# Patient Record
Sex: Female | Born: 1985 | Race: White | Hispanic: No | Marital: Single | Smoking: Current every day smoker
Health system: Southern US, Community
[De-identification: ages and names within clinical notes are randomized; demographics above are authoritative.]

## PROBLEM LIST (undated history)

## (undated) DIAGNOSIS — I10 Essential (primary) hypertension: Secondary | ICD-10-CM

## (undated) DIAGNOSIS — F4310 Post-traumatic stress disorder, unspecified: Secondary | ICD-10-CM

## (undated) DIAGNOSIS — F411 Generalized anxiety disorder: Secondary | ICD-10-CM

---
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
## 2004-08-19 ENCOUNTER — Emergency Department (HOSPITAL_COMMUNITY): Admission: EM | Admit: 2004-08-19 | Discharge: 2004-08-19 | Payer: Self-pay | Admitting: Emergency Medicine

## 2004-12-14 ENCOUNTER — Emergency Department: Payer: Self-pay | Admitting: Emergency Medicine

## 2005-04-14 ENCOUNTER — Emergency Department (HOSPITAL_COMMUNITY): Admission: EM | Admit: 2005-04-14 | Discharge: 2005-04-14 | Payer: Self-pay | Admitting: Emergency Medicine
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
## 2008-09-04 ENCOUNTER — Emergency Department (HOSPITAL_COMMUNITY): Admission: EM | Admit: 2008-09-04 | Discharge: 2008-09-04 | Payer: Self-pay | Admitting: Emergency Medicine

## 2008-09-27 ENCOUNTER — Emergency Department (HOSPITAL_COMMUNITY): Admission: EM | Admit: 2008-09-27 | Discharge: 2008-09-27 | Payer: Self-pay | Admitting: Emergency Medicine

## 2009-02-13 ENCOUNTER — Emergency Department (HOSPITAL_COMMUNITY): Admission: EM | Admit: 2009-02-13 | Discharge: 2009-02-13 | Payer: Self-pay | Admitting: Emergency Medicine

## 2009-02-23 ENCOUNTER — Emergency Department (HOSPITAL_COMMUNITY): Admission: EM | Admit: 2009-02-23 | Discharge: 2009-02-23 | Payer: Self-pay | Admitting: Emergency Medicine

## 2009-05-21 ENCOUNTER — Emergency Department (HOSPITAL_COMMUNITY): Admission: EM | Admit: 2009-05-21 | Discharge: 2009-05-21 | Payer: Self-pay | Admitting: Emergency Medicine

## 2010-01-16 ENCOUNTER — Emergency Department (HOSPITAL_COMMUNITY): Admission: EM | Admit: 2010-01-16 | Discharge: 2010-01-16 | Payer: Self-pay | Admitting: Emergency Medicine

## 2010-05-22 ENCOUNTER — Emergency Department (HOSPITAL_COMMUNITY): Admission: EM | Admit: 2010-05-22 | Discharge: 2010-05-22 | Payer: Self-pay | Admitting: Emergency Medicine

## 2010-05-25 ENCOUNTER — Emergency Department (HOSPITAL_COMMUNITY): Admission: EM | Admit: 2010-05-25 | Discharge: 2010-05-25 | Payer: Self-pay | Admitting: Emergency Medicine

## 2010-08-03 ENCOUNTER — Emergency Department (HOSPITAL_COMMUNITY): Admission: EM | Admit: 2010-08-03 | Discharge: 2010-08-03 | Payer: Self-pay | Admitting: Emergency Medicine

## 2010-09-21 ENCOUNTER — Emergency Department (HOSPITAL_COMMUNITY): Admission: EM | Admit: 2010-09-21 | Discharge: 2010-09-21 | Payer: Self-pay | Admitting: Emergency Medicine

## 2010-12-09 ENCOUNTER — Encounter
Admission: RE | Admit: 2010-12-09 | Discharge: 2010-12-09 | Payer: Self-pay | Source: Home / Self Care | Attending: Internal Medicine | Admitting: Internal Medicine

## 2010-12-15 ENCOUNTER — Emergency Department (HOSPITAL_COMMUNITY)
Admission: EM | Admit: 2010-12-15 | Discharge: 2010-12-15 | Payer: Self-pay | Source: Home / Self Care | Admitting: Emergency Medicine

## 2010-12-23 ENCOUNTER — Encounter: Admission: RE | Admit: 2010-12-23 | Payer: Self-pay | Source: Home / Self Care | Admitting: Internal Medicine

## 2010-12-25 ENCOUNTER — Encounter
Admission: RE | Admit: 2010-12-25 | Discharge: 2010-12-25 | Payer: Self-pay | Source: Home / Self Care | Attending: Internal Medicine | Admitting: Internal Medicine

## 2011-01-24 ENCOUNTER — Emergency Department (HOSPITAL_COMMUNITY)
Admission: EM | Admit: 2011-01-24 | Discharge: 2011-01-24 | Disposition: A | Discharge status: Home / Self Care | Payer: Medicaid Other | Attending: Emergency Medicine | Admitting: Emergency Medicine

## 2011-01-24 DIAGNOSIS — G8929 Other chronic pain: Secondary | ICD-10-CM | POA: Insufficient documentation

## 2011-01-24 DIAGNOSIS — IMO0001 Reserved for inherently not codable concepts without codable children: Secondary | ICD-10-CM | POA: Insufficient documentation

## 2011-02-23 ENCOUNTER — Emergency Department (HOSPITAL_BASED_OUTPATIENT_CLINIC_OR_DEPARTMENT_OTHER)
Admission: EM | Admit: 2011-02-23 | Discharge: 2011-02-23 | Disposition: A | Discharge status: Home / Self Care | Payer: Medicaid Other | Attending: Emergency Medicine | Admitting: Emergency Medicine

## 2011-02-23 DIAGNOSIS — Z8739 Personal history of other diseases of the musculoskeletal system and connective tissue: Secondary | ICD-10-CM | POA: Insufficient documentation

## 2011-02-23 DIAGNOSIS — Z79899 Other long term (current) drug therapy: Secondary | ICD-10-CM | POA: Insufficient documentation

## 2011-02-23 DIAGNOSIS — F319 Bipolar disorder, unspecified: Secondary | ICD-10-CM | POA: Insufficient documentation

## 2011-02-23 DIAGNOSIS — M79609 Pain in unspecified limb: Secondary | ICD-10-CM | POA: Insufficient documentation

## 2011-02-23 DIAGNOSIS — G8929 Other chronic pain: Secondary | ICD-10-CM | POA: Insufficient documentation

## 2011-02-23 DIAGNOSIS — R079 Chest pain, unspecified: Secondary | ICD-10-CM | POA: Insufficient documentation

## 2011-02-23 DIAGNOSIS — M549 Dorsalgia, unspecified: Secondary | ICD-10-CM | POA: Insufficient documentation

## 2011-03-13 LAB — URINALYSIS, ROUTINE W REFLEX MICROSCOPIC
Glucose, UA: NEGATIVE mg/dL
Hgb urine dipstick: NEGATIVE
Nitrite: POSITIVE — AB
Protein, ur: NEGATIVE mg/dL
Urobilinogen, UA: 0.2 mg/dL (ref 0.0–1.0)
pH: 6 (ref 5.0–8.0)

## 2011-03-13 LAB — URINE MICROSCOPIC-ADD ON

## 2011-04-18 ENCOUNTER — Ambulatory Visit
Admission: RE | Admit: 2011-04-18 | Discharge: 2011-04-18 | Disposition: A | Discharge status: Home / Self Care | Payer: Medicaid Other | Source: Ambulatory Visit | Attending: Family Medicine | Admitting: Family Medicine

## 2011-04-18 ENCOUNTER — Other Ambulatory Visit: Payer: Self-pay | Admitting: Family Medicine

## 2011-04-18 DIAGNOSIS — M069 Rheumatoid arthritis, unspecified: Secondary | ICD-10-CM

## 2011-04-18 DIAGNOSIS — M25659 Stiffness of unspecified hip, not elsewhere classified: Secondary | ICD-10-CM

## 2011-04-18 DIAGNOSIS — M25552 Pain in left hip: Secondary | ICD-10-CM

## 2011-04-18 DIAGNOSIS — M545 Low back pain: Secondary | ICD-10-CM
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
## 2011-09-01 LAB — URINE MICROSCOPIC-ADD ON

## 2011-09-01 LAB — URINALYSIS, ROUTINE W REFLEX MICROSCOPIC
Bilirubin Urine: NEGATIVE
Specific Gravity, Urine: 1.021
pH: 7.5

## 2011-09-01 LAB — POCT PREGNANCY, URINE: Preg Test, Ur: NEGATIVE

## 2015-04-02 ENCOUNTER — Encounter (HOSPITAL_COMMUNITY): Payer: Self-pay | Admitting: Emergency Medicine

## 2015-04-02 ENCOUNTER — Emergency Department (HOSPITAL_COMMUNITY)
Admission: EM | Admit: 2015-04-02 | Discharge: 2015-04-02 | Disposition: A | Discharge status: Home / Self Care | Payer: Medicaid Other | Attending: Emergency Medicine | Admitting: Emergency Medicine

## 2015-04-02 DIAGNOSIS — Z720 Tobacco use: Secondary | ICD-10-CM | POA: Insufficient documentation

## 2015-04-02 DIAGNOSIS — K0381 Cracked tooth: Secondary | ICD-10-CM | POA: Insufficient documentation

## 2015-04-02 DIAGNOSIS — K0889 Other specified disorders of teeth and supporting structures: Secondary | ICD-10-CM

## 2015-04-02 DIAGNOSIS — K029 Dental caries, unspecified: Secondary | ICD-10-CM | POA: Insufficient documentation

## 2015-04-02 DIAGNOSIS — K088 Other specified disorders of teeth and supporting structures: Secondary | ICD-10-CM | POA: Insufficient documentation

## 2015-04-02 DIAGNOSIS — R590 Localized enlarged lymph nodes: Secondary | ICD-10-CM | POA: Insufficient documentation

## 2015-04-02 MED ORDER — IBUPROFEN 800 MG PO TABS: 800.0000 mg | ORAL_TABLET | Freq: Three times a day (TID) | ORAL | Status: AC

## 2015-04-02 MED ORDER — PENICILLIN V POTASSIUM 500 MG PO TABS: 500.0000 mg | ORAL_TABLET | Freq: Four times a day (QID) | ORAL | Status: AC

## 2015-04-02 NOTE — ED Provider Notes (Signed)
CSN: [id number]     Arrival date & time 04/02/15  1728 History   First MD Initiated Contact with Patient 04/02/15 1843     Chief Complaint  Patient presents with  . Dental Pain     (Consider location/radiation/quality/duration/timing/severity/associated sxs/prior Treatment) HPI Jennifer Moses is a 29 year old female with no known metastatic medical history who presents the ER complaining of dental pain. Patient reports diffuse dental caries, and multiple dental issues in the past. Patient reports yesterday cracked a tooth while eating a cracker, has had acute dental pain since. Patient states she does not currently have dentistry follow-up, nor does she have PCP. Patient states using Advil to help with her pain. Patient denies dysphagia, throat or mouth swelling, fever, nausea, vomiting, headache.   History reviewed. No pertinent past medical history. History reviewed. No pertinent past surgical history. No family history on file. History  Substance Use Topics  . Smoking status: Current Every Day Smoker -- 0.10 packs/day    Types: Cigarettes  . Smokeless tobacco: Not on file  . Alcohol Use: No   OB History    No data available     Review of Systems  Constitutional: Negative for fever.  HENT: Positive for dental problem.   Eyes: Negative for visual disturbance.  Respiratory: Negative for shortness of breath.   Cardiovascular: Negative for chest pain.  Gastrointestinal: Negative for nausea, vomiting and abdominal pain.  Genitourinary: Negative for dysuria.  Skin: Negative for rash.  Neurological: Negative for dizziness, syncope, weakness and numbness.  Psychiatric/Behavioral: Negative.       Allergies  Bee venom  Home Medications   Prior to Admission medications   Medication Sig Start Date End Date Taking? Authorizing Provider  ibuprofen (ADVIL,MOTRIN) 800 MG tablet Take 1 tablet (800 mg total) by mouth 3 (three) times daily. 04/02/15   Ladona Mow, PA-C  penicillin v  potassium (VEETID) 500 MG tablet Take 1 tablet (500 mg total) by mouth 4 (four) times daily. 04/02/15 04/09/15  Ladona Mow, PA-C   BP 141/83 mmHg  Pulse 75  Temp(Src) 98 F (36.7 C) (Oral)  Resp 18  SpO2 99%  LMP 03/19/2015 Physical Exam  Constitutional: She is oriented to person, place, and time. She appears well-developed and well-nourished. No distress.  HENT:  Head: Normocephalic and atraumatic.  Mouth/Throat: Uvula is midline, oropharynx is clear and moist and mucous membranes are normal. No trismus in the jaw. Dental caries present. No dental abscesses or uvula swelling. No oropharyngeal exudate, posterior oropharyngeal edema, posterior oropharyngeal erythema or tonsillar abscesses.  Diffuse dental caries upper and lower. There is noted a broken upper anterior premolar. Gumline intact. Flor mouth soft. Jawline without swelling. Mild anterior cervical lymphadenopathy. No evidence of induration, cellulitis or gross abscess.  Eyes: EOM are normal. Pupils are equal, round, and reactive to light. Right eye exhibits no discharge. Left eye exhibits no discharge. No scleral icterus.  Neck: Normal range of motion.  Pulmonary/Chest: Effort normal. No respiratory distress.  Musculoskeletal: Normal range of motion.  Neurological: She is alert and oriented to person, place, and time.  Skin: Skin is warm and dry. She is not diaphoretic.  Psychiatric: She has a normal mood and affect.  Nursing note and vitals reviewed.   ED Course  Procedures (including critical care time) Labs Review Labs Reviewed - No data to display  Imaging Review No results found.   EKG Interpretation None      MDM   Final diagnoses:  Pain, dental   Patient with  toothache.  Patient afebrile, hemodynamically stable, well-appearing and in no acute distress. No gross abscess.  Exam unconcerning for Ludwig's angina or spread of infection.  Will treat with penicillin and pain medicine.  Urged patient to follow-up with  dentist.  Discussed return precautions with patient, patient verbalizes understanding and agreement of this plan.   BP 141/83 mmHg  Pulse 75  Temp(Src) 98 F (36.7 C) (Oral)  Resp 18  SpO2 99%  LMP 03/19/2015  Signed,  Ladona MowJoe Gianni Fuchs, PA-C 8:22 PM    Ladona MowJoe Tali Cleaves, PA-C 04/02/15 2022  Azalia BilisKevin Campos, MD 04/03/15 [phone]

## 2015-04-02 NOTE — ED Notes (Addendum)
Pt reports cracked left upper tooth today eating cracker; pt states trying to get into dentist for over a year. Upon assessment generalized decay noted on ALL teeth. Pt reports took motrin prior to arrival.

## 2015-04-02 NOTE — Discharge Instructions (Signed)
Dental Pain °A tooth ache may be caused by cavities (tooth decay). Cavities expose the nerve of the tooth to air and hot or cold temperatures. It may come from an infection or abscess (also called a boil or furuncle) around your tooth. It is also often caused by dental caries (tooth decay). This causes the pain you are having. °DIAGNOSIS  °Your caregiver can diagnose this problem by exam. °TREATMENT  °· If caused by an infection, it may be treated with medications which kill germs (antibiotics) and pain medications as prescribed by your caregiver. Take medications as directed. °· Only take over-the-counter or prescription medicines for pain, discomfort, or fever as directed by your caregiver. °· Whether the tooth ache today is caused by infection or dental disease, you should see your dentist as soon as possible for further care. °SEEK MEDICAL CARE IF: °The exam and treatment you received today has been provided on an emergency basis only. This is not a substitute for complete medical or dental care. If your problem worsens or new problems (symptoms) appear, and you are unable to meet with your dentist, call or return to this location. °SEEK IMMEDIATE MEDICAL CARE IF:  °· You have a fever. °· You develop redness and swelling of your face, jaw, or neck. °· You are unable to open your mouth. °· You have severe pain uncontrolled by pain medicine. °MAKE SURE YOU:  °· Understand these instructions. °· Will watch your condition. °· Will get help right away if you are not doing well or get worse. °Document Released: 11/17/2005 Document Revised: 02/09/2012 Document Reviewed: 07/05/2008 °ExitCare® Patient Information ©2015 [employer]. This information is not intended to replace advice given to you by your health care provider. Make sure you discuss any questions you have with your health care provider. ° °Emergency Department Resource Guide °1) Find a Doctor and Pay Out of Pocket °Although you won't have to find out who  is covered by your insurance plan, it is a good idea to ask around and get recommendations. You will then need to call the office and see if the doctor you have chosen will accept you as a new patient and what types of options they offer for patients who are self-pay. Some doctors offer discounts or will set up payment plans for their patients who do not have insurance, but you will need to ask so you aren't surprised when you get to your appointment. ° °2) Contact Your Local Health Department °Not all health departments have doctors that can see patients for sick visits, but many do, so it is worth a call to see if yours does. If you don't know where your local health department is, you can check in your phone book. The CDC also has a tool to help you locate your state's health department, and many state websites also have listings of all of their local health departments. ° °3) Find a Walk-in Clinic °If your illness is not likely to be very severe or complicated, you may want to try a walk in clinic. These are popping up all over the country in pharmacies, drugstores, and shopping centers. They're usually staffed by nurse practitioners or physician assistants that have been trained to treat common illnesses and complaints. They're usually fairly quick and inexpensive. However, if you have serious medical issues or chronic medical problems, these are probably not your best option. ° °No Primary Care Doctor: °- Call [facility] at  832-8000 - they can help you locate a primary   care doctor that  accepts your insurance, provides certain services, etc. °- Physician Referral Service- 1-800-533-3463 ° °Chronic Pain Problems: °Organization         Address  Phone   Notes  °[facility]  (336) 297-2271 Patients need to be referred by their primary care doctor.  ° °Medication Assistance: °Organization         Address  Phone   Notes  °[place] [facility] [address]  [address] °[place], [place] [address] (336) 641-8030 --Must be a resident of [place] °-- Must have NO insurance coverage whatsoever (no Medicaid/ Medicare, etc.) °-- The pt. MUST have a primary care doctor that directs their care regularly and follows them in the community °  °[facility]  (866) 331-1348   °[employer]  (888) 892-1162   ° °Agencies that provide inexpensive medical care: °Organization         Address  Phone   Notes  °[facility]  (336) 832-8035   °[facility]    (336) 832-7272   °[facility] [address] °[place], [place] [address] (336) 832-4777   °[facility] [address], °[place] (336) 271-4999   °[facility]    (336) 373-0678   °[facility]    (336) 272-1050   °[facility] ° [address], [place] Phone:  (336) 832-4444, Fax:  (336) 832-4440 Hours of Operation:  9 am - 6 pm, M-F.  Also accepts Medicaid/Medicare and self-pay.  °[facility] ° [address], [place] Phone: (336) 832-3150, Fax: (336) 832-3151. Hours of Operation:  8:30 am - 5:30 pm, M-F.  Also accepts Medicaid and self-pay.  °[facility] [place] [address], [place] Phone: (336) 878-6027   °[facility] [address], [place], [place] (336)723-1848, Ext. 123 Mondays & Thursdays: 7-9 AM.  First 15 patients are seen on a first come, first serve basis. °  ° °Medicaid-accepting [place] Providers: ° °Organization         Address  Phone   Notes  °[facility] [address], [place] (336) 641-2100 Also accepts self-pay patients.  °[facility] [address], [place] ° (336) 856-9996   °[facility] [address], [place] (336) 288-8857   °[facility] [address] [place] [address], [place] (336) 299-7000   °Veita Bland [address], [place]  ° (336) 373-1557 Only accepts [place] Access Medicaid patients after they have their name applied to their card.  ° °Self-Pay (no insurance) in [place]: ° °Organization         Address  Phone   Notes  °Sickle Cell Patients, [facility] [address], [place] (336) 832-1970   °[facility] [address], [place] (336) 832-4400   °[facility] ° [address], [place] (336) 992-4800   °[facility]/Dr. Osei-Bonsu ° [address] [place] [address], [place] or [address], [place] (336) 841-8500 Phone number for both [place] and [place] locations is the same.  °[facility] [address], [place] (336) 299-0000   °[facility] [address] [place] [address], [place] or [address] (336) 852-7530 °(336) 878-2260   °  [facility] [address], [place] (336) 350-1642, phone; (336) 294-5005, fax Sees patients 1st and 3rd Saturday of every month.  Must not qualify for public or private insurance (i.e. Medicaid, Medicare, [place] Health Choice, Veterans' Benefits) • Household income should be no more than 200% of the poverty level •The clinic cannot treat you if you are pregnant or think you are pregnant • Sexually transmitted diseases are not treated at the clinic.  ° ° °Dental Care: °Organization         Address  Phone  Notes  °[facility] [address], [place] (336) 641-6152 Accepts children up to age 21 who are enrolled in Medicaid or [place] Health Choice; pregnant women with a Medicaid card; and children who have applied for Medicaid or [place] Health Choice, but were declined, whose parents can pay a reduced fee at time of service.  °[facility] [place]  [address], [place] (336) 641-7733 Accepts children up to age 21 who are  enrolled in Medicaid or [place] Health Choice; pregnant women with a Medicaid card; and children who have applied for Medicaid or [place] Health Choice, but were declined, whose parents can pay a reduced fee at time of service.  °[facility] ° [address], [place] (336) 641-4533 Patients are seen by appointment only. Walk-ins are not accepted. [facility] will see patients 18 years of age and older. °Monday - Tuesday (8am-5pm) °Most Wednesdays (8:30-5pm) °$30 per visit, cash only  °[facility] ° [address], [place] (336) 641-4533 Patients are seen by appointment only. Walk-ins are not accepted. [facility] will see patients 18 years of age and older. °One Wednesday Evening (Monthly: Volunteer Based).  $30 per visit, cash only  °[facility]  (919) 537-3737 for adults; Children under age 4, call Graduate Pediatric Dentistry at ([phone]) [phone]. Children aged 4-14, please call (919) [phone] to request a pediatric application. ° Dental services are provided in all areas of dental care including fillings, crowns and bridges, complete and partial dentures, implants, gum treatment, root canals, and extractions. Preventive care is also provided. Treatment is provided to both adults and children. °Patients are selected via a lottery and there is often a waiting list. °  °[facility] [address], °[place] ° (336) 763-8833 [email or web address] °  °[facility] [address], [place], [place] (336)723-1848, Ext. 123 Second and Fourth Thursday of each month, opens at 6:30 AM; Clinic ends at 9 AM.  Patients are seen on a first-come first-served basis, and a limited number are seen during each clinic.  ° °[facility] ° [address], [place], [place] (336) 723-7904   Eligibility Requirements °You must have lived in [place], [place], or [place] counties for at least the last three months. °  You  cannot be eligible for state or federal sponsored healthcare insurance, including Veterans Administration, Medicaid, or Medicare. °  You generally cannot be eligible for healthcare insurance through your employer.  °  How to apply: °Eligibility screenings are held every Tuesday and Wednesday afternoon from 1:00 pm until 4:00 pm. You do not need an appointment for the interview!  °[facility] [address], [place], [place] 336-631-2330   °[facility]  336-342-8273   °[facility]  336-703-3100   °[facility]  336-570-6415   ° °Behavioral Health Resources in the Community: °Intensive Outpatient Programs °Organization         Address  Phone  Notes  °[facility] [address], [place], [place] 336-878-6098   °[facility] [address], [place], [place] 336-832-9800   °[facility] [address], [place], [place] ° 336-882-2125   °[facility] [address],  °[place], [place] 1-800-853-5163 or 336-641-4981   °Substance Abuse Resources °Organization         Address  Phone  Notes  °[facility]  336-882-2125   °[facility]  336-784-9470   °[facility]  336-285-9073   °[facility]  336-845-3988   °Residential & Outpatient Substance Abuse Program  1-800-659-3381   °Psychological Services °Organization         Address  Phone  Notes  °[facility]  336- 832-9600   °[facility]  336- 378-7881   °[facility] [address], [place] 1-800-853-5163 or 336-641-4981   ° °Mobile Crisis Teams °Organization         Address  Phone  Notes  °[facility], Mobile Crisis Care Unit  1-877-626-1772   °Assertive °[facility] ° [address] [place], [place] 336-834-9664   °Sharon DeEsch [address] °[place] 336-554-5454   ° °Self-Help/Support  Groups °Organization         Address  Phone             Notes  °[facility] - variety of support groups  336- 373-1402 Call for more information  °Narcotics Anonymous (NA), [facility] [address], °[place]  2 meetings at this location  ° °Residential Treatment Programs °Organization         Address  Phone  Notes  °[facility] [address],    °[place]  1-866-801-8205   °[facility] ° [address], [place], [place] 704-293-8524   °[facility] [address], [place] 336-845-3988 Admissions: 8am-3pm M-F  °[facility] [address]    °[place], [place] 336-841-1104   °[facility] [address], [place], [place] 336-379-7146   °[facility] [address]  °[place], [place] 336-285-9073   °[facility] - Intensive Outpatient [address], [place], [place] 336-852-3033   °[facility] [address]  °[place], [place] 1-877-615-2722 or 336-784-9470   °[facility] [address] [place], [place] 336-227-7417 Accepts Medicaid  °[facility] [address]  ° [place] 1-800-659-3381 Substance Abuse/Addiction Treatment  ° °[place] Behavioral Health Resources °Organization         Address  Phone  Notes  °[facility]  (888) 581-9988   °Julie Brannon, PhD [address] [place], [place]   (336) 349-5553 or (336) 951-0000   °[facility]   [address] °[place], [place] (336) 349-4454   °[facility] [address], [place],  (336) 342-8316 Insurance/Medicaid/sponsorship through [facility]  °[facility] [address]                                    [place],  (336) 342-8316 Therapy/tele-psych/case  °[facility]   [address]  ° [place], [place] (336) 349-2233    °Dr. Arfeen  (336) 349-4544   °[facility] [place]  [facility] [place] [facility] 1) [address], [place] °2) [address], [place] °3)  [address], [place] (336) 349-3220 °(336) 342-7768 ° °(336) 342-8140   °[place] [facility] (336) 342-1394 or (336) 342-3537 (After Hours)    ° ° ° °

## 2020-04-23 ENCOUNTER — Encounter (HOSPITAL_COMMUNITY): Payer: Self-pay

## 2020-04-23 ENCOUNTER — Other Ambulatory Visit: Payer: Self-pay

## 2020-04-23 ENCOUNTER — Emergency Department (HOSPITAL_COMMUNITY): Payer: Medicaid Other

## 2020-04-23 ENCOUNTER — Emergency Department (HOSPITAL_COMMUNITY)
Admission: EM | Admit: 2020-04-23 | Discharge: 2020-04-23 | Disposition: A | Discharge status: Home / Self Care | Payer: Medicaid Other | Attending: Emergency Medicine | Admitting: Emergency Medicine

## 2020-04-23 DIAGNOSIS — M25512 Pain in left shoulder: Secondary | ICD-10-CM | POA: Diagnosis present

## 2020-04-23 DIAGNOSIS — F17210 Nicotine dependence, cigarettes, uncomplicated: Secondary | ICD-10-CM | POA: Insufficient documentation

## 2020-04-23 DIAGNOSIS — G8929 Other chronic pain: Secondary | ICD-10-CM | POA: Diagnosis not present

## 2020-04-23 MED ORDER — DICLOFENAC SODIUM 3 % EX GEL: 2.0000 g | Freq: Four times a day (QID) | CUTANEOUS | 0 refills | Status: AC

## 2020-04-23 MED ORDER — CYCLOBENZAPRINE HCL 10 MG PO TABS
10.0000 mg | ORAL_TABLET | Freq: Three times a day (TID) | ORAL | 0 refills | Status: AC | PRN
Start: 2020-04-23 — End: ?

## 2020-04-23 NOTE — Discharge Instructions (Signed)
You were seen in the ER for ongoing shoulder, chest pain  EKG, x-rays are normal  The cause of your pain is likely related to an orthopedic or musculoskeletal issue possibly in your neck or shoulder  For pain and inflammation you can use a combination of ibuprofen and acetaminophen.  Take [phone] mg acetaminophen (tylenol) every 6 hours or 600 mg ibuprofen (advil, motrin) every 6 hours.  You can take these separately or combine them every 6 hours for maximum pain control. Do not exceed 4,000 mg acetaminophen or 2,400 mg ibuprofen in a 24 hour period.  Do not take ibuprofen containing products if you have history of kidney disease, ulcers, GI bleeding, severe acid reflux, or take a blood thinner.  Do not take acetaminophen if you have liver disease.   Use diclofenac gel - rub and massage until dry, cover with a heating pad  Flexeril will help with spasm and tightness, as needed  Return to ER for chest pain or shortness breath with activity or exertion, shortness of breath, sudden loss of function or weakness to extremity

## 2020-04-23 NOTE — ED Notes (Signed)
Pt refused discharge vital signs
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
## 2020-04-23 NOTE — ED Triage Notes (Signed)
Pt reports intermittent left shoulder pain that radiates to her armpit, left chest, and shoulder blade x1 month. Pt reports that she is worried because her mother dies of cancer in her 30s. Pt also reports intermittent shob.

## 2021-04-21 IMAGING — CR DG SHOULDER 2+V*L*
3 series · 3 of 3 positions shown · non-contrast
Comparison: None.

CLINICAL DATA: Left shoulder pain for 1 month.

EXAM:
LEFT SHOULDER - 2+ VIEW

[w shoulder external left]
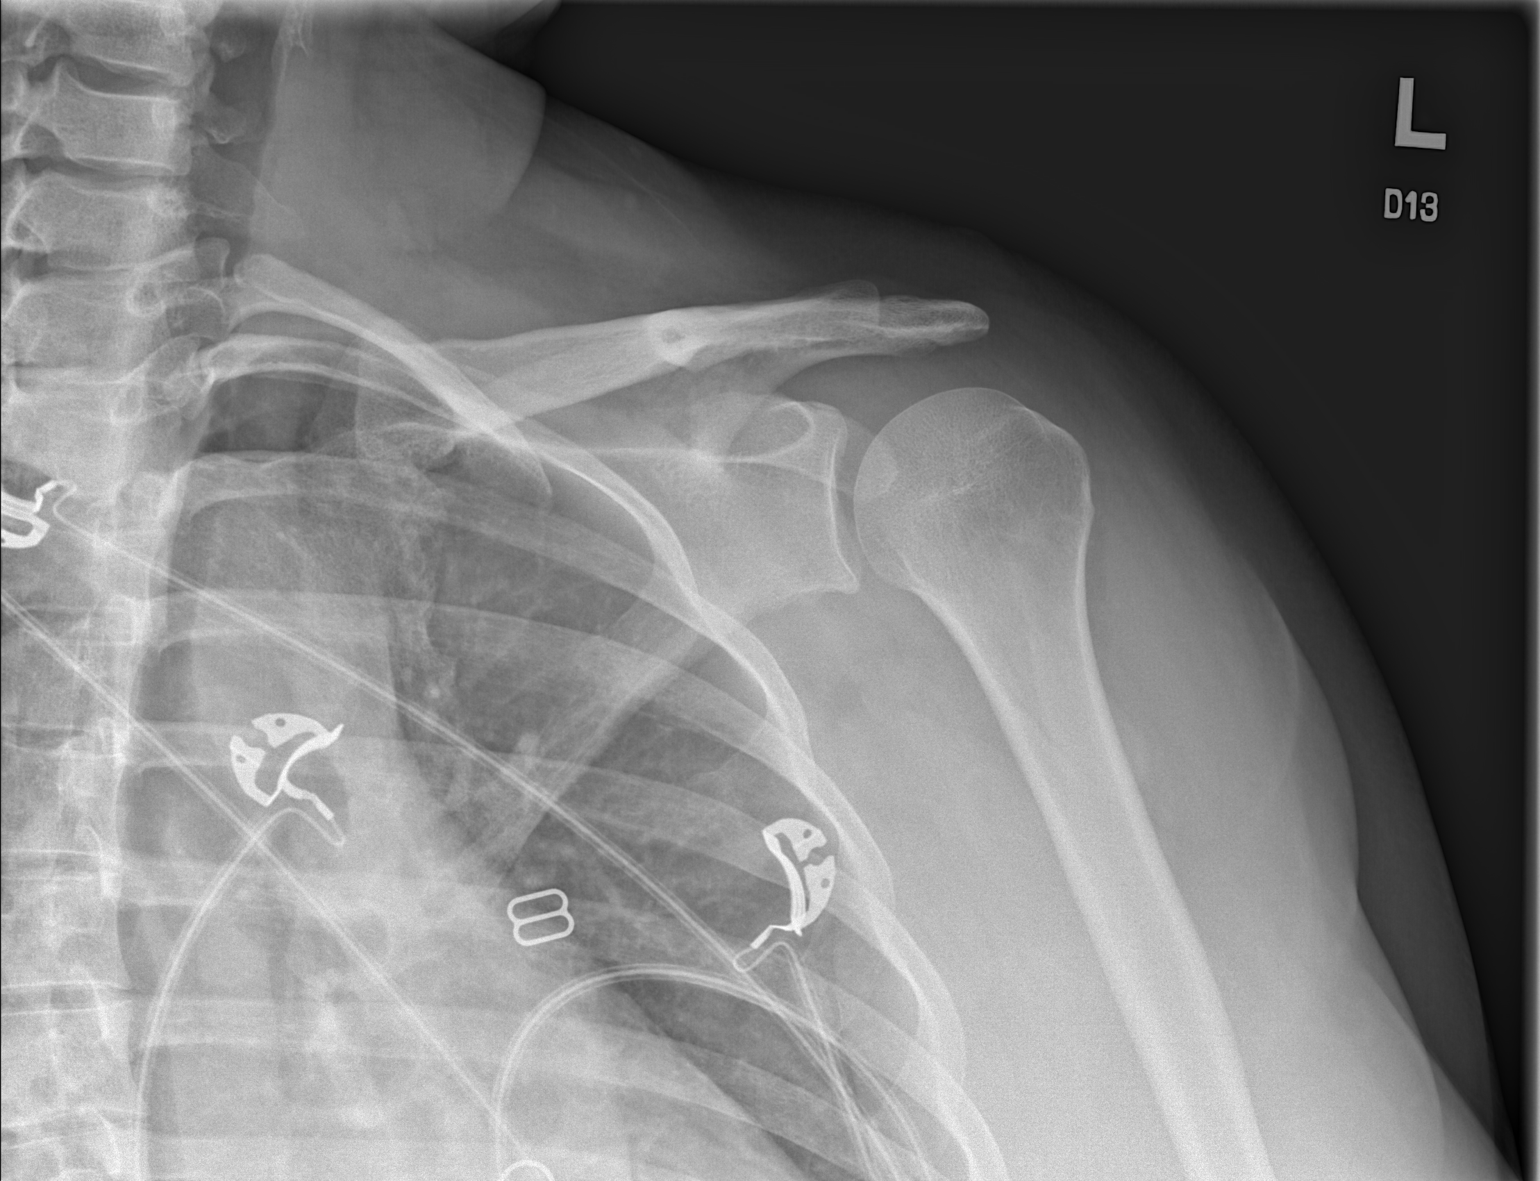

[w shoulder y-view left]
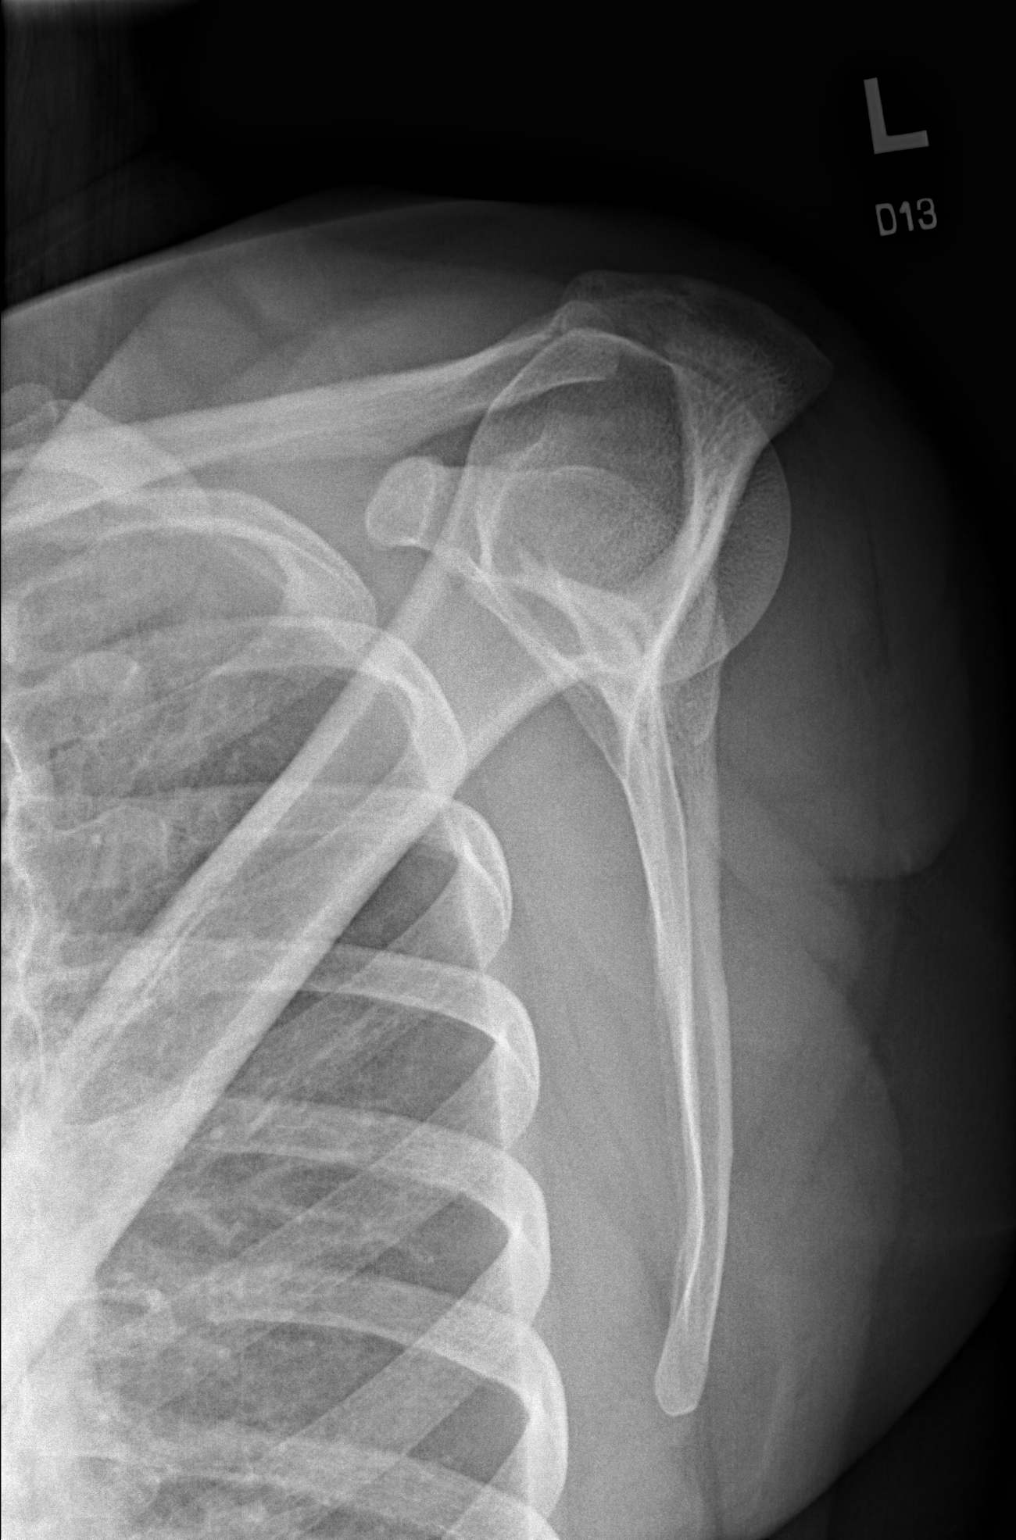

[x shoulder axillary left]
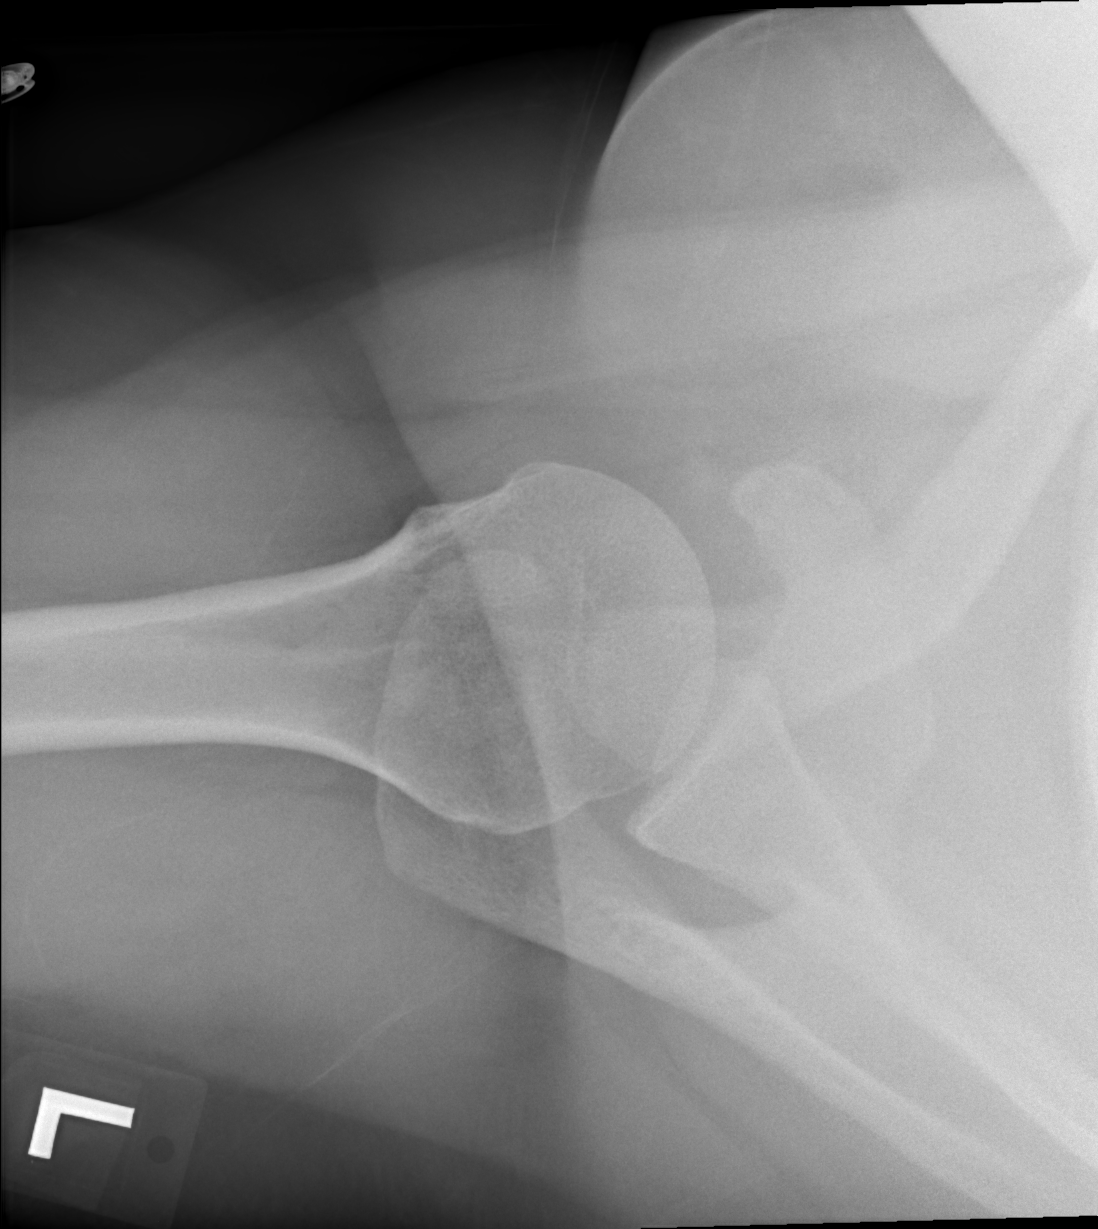

[3 of 3 positions shown; findings below may reference images not displayed]

FINDINGS: There is no evidence of fracture or dislocation. There is no
evidence of arthropathy or other focal bone abnormality. Soft
tissues are unremarkable.
IMPRESSION: Negative.

## 2023-01-07 ENCOUNTER — Encounter (HOSPITAL_COMMUNITY): Payer: Self-pay | Admitting: Emergency Medicine

## 2023-01-07 ENCOUNTER — Emergency Department (HOSPITAL_COMMUNITY)
Admission: EM | Admit: 2023-01-07 | Discharge: 2023-01-08 | Disposition: A | Discharge status: Home / Self Care | Payer: Medicaid Other | Attending: Emergency Medicine | Admitting: Emergency Medicine

## 2023-01-07 ENCOUNTER — Emergency Department (HOSPITAL_COMMUNITY): Payer: Medicaid Other

## 2023-01-07 ENCOUNTER — Other Ambulatory Visit: Payer: Self-pay

## 2023-01-07 DIAGNOSIS — D649 Anemia, unspecified: Secondary | ICD-10-CM | POA: Insufficient documentation

## 2023-01-07 DIAGNOSIS — Z20822 Contact with and (suspected) exposure to covid-19: Secondary | ICD-10-CM | POA: Insufficient documentation

## 2023-01-07 DIAGNOSIS — I159 Secondary hypertension, unspecified: Secondary | ICD-10-CM | POA: Diagnosis not present

## 2023-01-07 DIAGNOSIS — R0789 Other chest pain: Secondary | ICD-10-CM

## 2023-01-07 DIAGNOSIS — R42 Dizziness and giddiness: Secondary | ICD-10-CM | POA: Diagnosis present

## 2023-01-07 DIAGNOSIS — I10 Essential (primary) hypertension: Secondary | ICD-10-CM | POA: Diagnosis not present

## 2023-01-07 HISTORY — DX: Post-traumatic stress disorder, unspecified: F43.10

## 2023-01-07 HISTORY — DX: Generalized anxiety disorder: F41.1

## 2023-01-07 HISTORY — DX: Essential (primary) hypertension: I10

## 2023-01-07 LAB — HEPATIC FUNCTION PANEL
ALT: 15 U/L (ref 0–44)
AST: 19 U/L (ref 15–41)
Albumin: 4.3 g/dL (ref 3.5–5.0)
Alkaline Phosphatase: 86 U/L (ref 38–126)
Bilirubin, Direct: <0.1 mg/dL (ref 0.0–0.2)
Total Bilirubin: 0.5 mg/dL (ref 0.3–1.2)
Total Protein: 9.2 g/dL — ABNORMAL HIGH (ref 6.5–8.1)

## 2023-01-07 LAB — BASIC METABOLIC PANEL
Anion gap: 4 — ABNORMAL LOW (ref 5–15)
BUN: 6 mg/dL (ref 6–20)
CO2: 26 mmol/L (ref 22–32)
Calcium: 8.6 mg/dL — ABNORMAL LOW (ref 8.9–10.3)
Chloride: 105 mmol/L (ref 98–111)
Creatinine, Ser: 0.67 mg/dL (ref 0.44–1.00)
GFR, Estimated: >60 mL/min (ref >60)
Glucose, Bld: 116 mg/dL — ABNORMAL HIGH (ref 70–99)
Potassium: 3.9 mmol/L (ref 3.5–5.1)
Sodium: 135 mmol/L (ref 135–145)

## 2023-01-07 LAB — RESP PANEL BY RT-PCR (RSV, FLU A&B, COVID)  RVPGX2
Influenza A by PCR: NEGATIVE
Influenza B by PCR: NEGATIVE
Resp Syncytial Virus by PCR: NEGATIVE
SARS Coronavirus 2 by RT PCR: NEGATIVE

## 2023-01-07 LAB — URINALYSIS, ROUTINE W REFLEX MICROSCOPIC
Bacteria, UA: NONE SEEN
Bilirubin Urine: NEGATIVE
Glucose, UA: NEGATIVE mg/dL
Ketones, ur: NEGATIVE mg/dL
Leukocytes,Ua: NEGATIVE
Nitrite: NEGATIVE
Protein, ur: NEGATIVE mg/dL
Specific Gravity, Urine: 1.006 (ref 1.005–1.030)
pH: 8 (ref 5.0–8.0)

## 2023-01-07 LAB — CBC
HCT: 32.7 % — ABNORMAL LOW (ref 36.0–46.0)
Hemoglobin: 9.7 g/dL — ABNORMAL LOW (ref 12.0–15.0)
MCH: 22.6 pg — ABNORMAL LOW (ref 26.0–34.0)
MCHC: 29.7 g/dL — ABNORMAL LOW (ref 30.0–36.0)
MCV: 76.2 fL — ABNORMAL LOW (ref 80.0–100.0)
Platelets: 274 10*3/uL (ref 150–400)
RBC: 4.29 MIL/uL (ref 3.87–5.11)
RDW: 15.8 % — ABNORMAL HIGH (ref 11.5–15.5)
WBC: 4.4 10*3/uL (ref 4.0–10.5)
nRBC: 0 % (ref 0.0–0.2)

## 2023-01-07 LAB — LIPASE, BLOOD: Lipase: 28 U/L (ref 11–51)

## 2023-01-07 LAB — TROPONIN I (HIGH SENSITIVITY): Troponin I (High Sensitivity): <2 ng/L (ref <18)

## 2023-01-07 MED ORDER — LACTATED RINGERS IV BOLUS
1000.0000 mL | Freq: Once | INTRAVENOUS | Status: AC
  Administered 2023-01-07: 1000 mL via INTRAVENOUS

## 2023-01-07 NOTE — ED Provider Triage Note (Signed)
Emergency Medicine Provider Triage Evaluation Note  Jennifer Moses , a 37 y.o. female  was evaluated in triage.  Pt complains of intermittent midsternal chest pressure for the past week and a half is associated with lightheadedness, nausea, shortness of breath, and diaphoresis.  States that she has never had the symptoms before.  Of note, she had a cold approximately 2 weeks ago for which she took over-the-counter medications but the symptoms have resolved.  She denies fever, chills, vomiting, diarrhea, abdominal pain, urinary symptoms, back pain, headache, vision changes, focal weakness, or other concerns at this time.  No personal or family history of CAD, CHF, or arrhythmia.  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  Review of Systems  Positive: See HPI Negative: See HPI  Physical Exam  BP (!) 178/101 (BP Location: Right Arm)   Pulse 97   Temp 98.8 F (37.1 C) (Oral)   Resp 16   Ht 5\' 5"  (1.651 m)   Wt 65.8 kg   LMP 01/05/2023 (Exact Date)   SpO2 100%   BMI 24.13 kg/m  Gen:   Awake, no distress  Resp:  Normal effort lungs clear to auscultation MSK:   Moves extremities without difficulty no lower extremity edema or tenderness Other:  Regular rate and rhythm, no chest wall tenderness, soft and nontender abdomen, patient very anxious and tearful in triage when discussing her symptoms and recent stressors  Medical Decision Making  Medically screening exam initiated at 7:39 PM.  Appropriate orders placed.  Opel H Popoca was informed that the remainder of the evaluation will be completed by another provider, this initial triage assessment does not replace that evaluation, and the importance of remaining in the ED until their evaluation is complete.     Suzzette Righter, PA-C 01/07/23 1941

## 2023-01-07 NOTE — ED Triage Notes (Signed)
Presents from home for feeling weak, dizzy, chest tightness for a 1.5weeks. Occurs intermittently but not specifically brought on by activity. Has had previous episodes but not at severe or lasting as long.   Had been on BP meds but these were Dced by MD over a year ago (carvedilol 3.125mg ). Took a dos eof this from her friend's supply this afternoon because she noted her BP was high. 178/101 in triage.

## 2023-01-07 NOTE — ED Provider Notes (Incomplete)
[place] EMERGENCY DEPARTMENT AT [facility] Provider Note   CSN: [id number] Arrival date & time: 01/07/23  7824     History {Add pertinent medical, surgical, social history, OB history to HPI:1} No chief complaint on file.   Jennifer Moses is a 37 y.o. female who presents with a week of lightheadedness, chest tightness, feeling near syncopal with increased anxiety.  Also states that she had high blood pressure today and took a dose of her friend's carvedilol. Lightheadedness worse with change of position.   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  No fevers or chills.  Also states that her husband told her that he is currently formula.  In the emergency department if she has untreated anxiety on treatment hypertension may be contributing to her symptoms.  HPI     Home Medications Prior to Admission medications   Medication Sig Start Date End Date Taking? Authorizing Provider  cyclobenzaprine (FLEXERIL) 10 MG tablet Take 1 tablet (10 mg total) by mouth 3 (three) times daily as needed for muscle spasms. 04/23/20   Kinnie Feil, PA-C  Diclofenac Sodium 3 % GEL Apply 2 g topically in the morning, at noon, in the evening, and at bedtime. 04/23/20   Kinnie Feil, PA-C  ibuprofen (ADVIL,MOTRIN) 800 MG tablet Take 1 tablet (800 mg total) by mouth 3 (three) times daily. 04/02/15   Dahlia Bailiff, PA-C      Allergies    Bee venom    Review of Systems   Review of Systems  Constitutional:  Negative for activity change, appetite change, chills, diaphoresis, fatigue and fever.  HENT: Negative.    Eyes: Negative.   Respiratory:  Positive for chest tightness. Negative for shortness of breath.   Cardiovascular:  Positive for palpitations. Negative for chest pain and leg swelling.   Gastrointestinal: Negative.   Genitourinary: Negative.   Neurological:  Positive for light-headedness. Negative for dizziness and syncope.    Physical Exam Updated Vital Signs BP (!) 166/107 (BP Location: Left Arm)   Pulse 67   Temp 98.4 F (36.9 C)   Resp 18   Ht 5\' 5"  (1.651 m)   Wt 65.8 kg   LMP 01/05/2023 (Exact Date)   SpO2 100%   BMI 24.13 kg/m  Physical Exam Vitals and nursing note reviewed.  Constitutional:      Appearance: She is not ill-appearing or toxic-appearing.  HENT:     Head: Normocephalic and atraumatic.     Mouth/Throat:     Mouth: Mucous membranes are moist.     Pharynx: No oropharyngeal exudate or posterior oropharyngeal erythema.  Eyes:     General:        Right eye: No discharge.        Left eye: No discharge.     Extraocular Movements: Extraocular movements intact.     Conjunctiva/sclera: Conjunctivae normal.     Pupils: Pupils are equal, round, and reactive to light.  Cardiovascular:     Rate and Rhythm: Normal rate and regular rhythm.     Pulses: Normal pulses.     Heart sounds: Normal heart sounds. No murmur heard. Pulmonary:     Effort: Pulmonary effort is normal. No respiratory distress.     Breath sounds: Normal breath sounds. No wheezing or rales.  Abdominal:  General: Bowel sounds are normal. There is no distension.     Palpations: Abdomen is soft.     Tenderness: There is no abdominal tenderness. There is no guarding or rebound.  Musculoskeletal:        General: No deformity.     Cervical back: Neck supple.     Right lower leg: No edema.     Left lower leg: No edema.  Skin:    General: Skin is warm and dry.     Capillary Refill: Capillary refill takes less than 2 seconds.  Neurological:     General: No focal deficit present.     Mental Status: She is alert and oriented to person, place, and time. Mental status is at baseline.  Psychiatric:        Mood and Affect: Mood normal.     ED Results / Procedures / Treatments    Labs (all labs ordered are listed, but only abnormal results are displayed) Labs Reviewed  BASIC METABOLIC PANEL - Abnormal; Notable for the following components:      Result Value   Glucose, Bld 116 (*)    Calcium 8.6 (*)    Anion gap 4 (*)    All other components within normal limits  CBC - Abnormal; Notable for the following components:   Hemoglobin 9.7 (*)    HCT 32.7 (*)    MCV 76.2 (*)    MCH 22.6 (*)    MCHC 29.7 (*)    RDW 15.8 (*)    All other components within normal limits  URINALYSIS, ROUTINE W REFLEX MICROSCOPIC - Abnormal; Notable for the following components:   Color, Urine STRAW (*)    Hgb urine dipstick LARGE (*)    All other components within normal limits  RESP PANEL BY RT-PCR (RSV, FLU A&B, COVID)  [id number]  HEPATIC FUNCTION PANEL  LIPASE, BLOOD  TROPONIN I (HIGH SENSITIVITY)  TROPONIN I (HIGH SENSITIVITY)    EKG EKG Interpretation  Date/Time:  Wednesday January 07 2023 22:10:54 EST Ventricular Rate:  62 PR Interval:  191 QRS Duration: 97 QT Interval:  431 QTC Calculation: 438 R Axis:   56 Text Interpretation: Sinus rhythm Confirmed by Dorie Rank [phone]) on 01/07/2023 10:37:08 PM  Radiology DG Chest 2 View  Result Date: 01/07/2023 CLINICAL DATA:  Intermittent midsternal chest pain for the weekend half. Lightheadedness, of the nausea, shortness of breath, and diaphoresis. EXAM: CHEST - 2 VIEW COMPARISON:  04/23/2020 FINDINGS: The heart size and mediastinal contours are within normal limits. Both lungs are clear. The visualized skeletal structures are unremarkable. IMPRESSION: No active cardiopulmonary disease. Electronically Signed   By: Lucienne Capers M.D.   On: 01/07/2023 19:54    Procedures Procedures  {Document cardiac monitor, telemetry assessment procedure when appropriate:1}  Medications Ordered in ED Medications  lactated ringers bolus 1,000 mL (1,000 mLs Intravenous New Bag/Given 01/07/23 2257)    ED Course/ Medical Decision Making/  A&P   {   Click here for ABCD2, HEART and other calculatorsREFRESH Note before signing :1}                          Medical Decision Making 37 year old female presents with lightheadedness, palpitations, anxiety.    Hypertensive on intake vitals otherwise normal.  Cardiopulmonary exam is normal, abdominal exam is benign.  Neurovascular intact in all extremities.  Normal neurologic exam. DDx includes allergic anxiety, hypertensive crisis, dysrhythmia, metabolic derangement.  Amount and/or Complexity of Data Reviewed Labs:  ordered.    Details: CBC without leukocytosis, anemia with hemoglobin of 9.7.  BMP unremarkable, troponin negative, 8 UA with large hemoglobin, patient currently on her menstrual cycle.   Radiology:     Details: Chest x-ray negative for acute cardiopulmonary disease visualized with provider ECG/medicine tests:     Details:  EKG with normal sinus rhythm without interval changes or STEMI.   ***  {Document critical care time when appropriate:1} {Document review of labs and clinical decision tools ie heart score, Chads2Vasc2 etc:1}  {Document your independent review of radiology images, and any outside records:1} {Document your discussion with family members, caretakers, and with consultants:1} {Document social determinants of health affecting pt's care:1} {Document your decision making why or why not admission, treatments were needed:1} Final Clinical Impression(s) / ED Diagnoses Final diagnoses:  None    Rx / DC Orders ED Discharge Orders     None

## 2023-01-08 ENCOUNTER — Emergency Department (HOSPITAL_COMMUNITY): Payer: Medicaid Other

## 2023-01-08 LAB — TROPONIN I (HIGH SENSITIVITY): Troponin I (High Sensitivity): 2 ng/L (ref <18)

## 2023-01-08 MED ORDER — VALSARTAN 40 MG PO TABS: 40.0000 mg | ORAL_TABLET | Freq: Every day | ORAL | 0 refills | Status: AC

## 2023-01-08 MED ORDER — IBUPROFEN 800 MG PO TABS
800.0000 mg | ORAL_TABLET | Freq: Once | ORAL | Status: AC
  Administered 2023-01-08: 800 mg via ORAL
  Filled 2023-01-08: qty 1

## 2023-01-08 MED ORDER — HYDROXYZINE HCL 25 MG PO TABS: 25.0000 mg | ORAL_TABLET | Freq: Four times a day (QID) | ORAL | 0 refills | Status: AC

## 2023-01-08 NOTE — ED Notes (Signed)
Patient states her ears are ringing and she has headache pain 7/10. Provider made aware.

## 2023-01-08 NOTE — ED Notes (Signed)
Patient given ginger-ale and graham crackers to eat.

## 2023-01-08 NOTE — Discharge Instructions (Signed)
You were seen in the ER today for your chest tightness, headache, and elevated blood pressure. Your workup was reassuring. Suspect contribution by both your high blood pressure and your anxiety to your symptoms. You may follow up with the cardiologist listed below for further evaluation of your palpitations.   You should take the prescribed BP medication daily, and may use the prescribed anxiety medication as needed. Return to the ER with any new severe symptoms.
